# Patient Record
Sex: Male | Born: 1980 | Race: Black or African American | Hispanic: No | Marital: Single | Smoking: Never smoker
Health system: Southern US, Community
[De-identification: ages and names within clinical notes are randomized; demographics above are authoritative.]

## PROBLEM LIST (undated history)

## (undated) DIAGNOSIS — K529 Noninfective gastroenteritis and colitis, unspecified: Secondary | ICD-10-CM

## (undated) HISTORY — PX: OTHER SURGICAL HISTORY: SHX169

---

## 2011-10-18 ENCOUNTER — Encounter (HOSPITAL_COMMUNITY): Payer: Self-pay | Admitting: *Deleted

## 2011-10-18 ENCOUNTER — Emergency Department (HOSPITAL_COMMUNITY)
Admission: EM | Admit: 2011-10-18 | Discharge: 2011-10-18 | Disposition: A | Discharge status: Home / Self Care | Payer: Self-pay | Attending: Emergency Medicine | Admitting: Emergency Medicine

## 2011-10-18 DIAGNOSIS — L03019 Cellulitis of unspecified finger: Secondary | ICD-10-CM | POA: Insufficient documentation

## 2011-10-18 DIAGNOSIS — E119 Type 2 diabetes mellitus without complications: Secondary | ICD-10-CM | POA: Insufficient documentation

## 2011-10-18 DIAGNOSIS — IMO0001 Reserved for inherently not codable concepts without codable children: Secondary | ICD-10-CM

## 2011-10-18 DIAGNOSIS — Z794 Long term (current) use of insulin: Secondary | ICD-10-CM | POA: Insufficient documentation

## 2011-10-18 HISTORY — DX: Noninfective gastroenteritis and colitis, unspecified: K52.9

## 2011-10-18 NOTE — ED Notes (Signed)
Pt alert & oriented x4, stable gait. Pt given discharge instructions, paperwork. Patient instructed to stop at the registration desk to finish any additional paperwork. pt verbalized understanding. Pt left department w/ no further questions.  

## 2011-10-18 NOTE — Discharge Instructions (Signed)
Paronychia Paronychia is an inflammatory reaction involving the folds of the skin surrounding the fingernail. This is commonly caused by an infection in the skin around a nail. The most common cause of paronychia is frequent wetting of the hands (as seen with bartenders, food servers, nurses or others who wet their hands). This makes the skin around the fingernail susceptible to infection by bacteria (germs) or fungus. Other predisposing factors are:  Aggressive manicuring.   Nail biting.   Thumb sucking.  The most common cause is a staphylococcal (a type of germ) infection, or a fungal (Candida) infection. When caused by a germ, it usually comes on suddenly with redness, swelling, pus and is often painful. It may get under the nail and form an abscess (collection of pus), or form an abscess around the nail. If the nail itself is infected with a fungus, the treatment is usually prolonged and may require oral medicine for up to one year. Your caregiver will determine the length of time treatment is required. The paronychia caused by bacteria (germs) may largely be avoided by not pulling on hangnails or picking at cuticles. When the infection occurs at the tips of the finger it is called felon. When the cause of paronychia is from the herpes simplex virus (HSV) it is called herpetic whitlow. TREATMENT  When an abscess is present treatment is often incision and drainage. This means that the abscess must be cut open so the pus can get out. When this is done, the following home care instructions should be followed. HOME CARE INSTRUCTIONS   It is important to keep the affected fingers very dry. Rubber or plastic gloves over cotton gloves should be used whenever the hand must be placed in water.   Keep wound clean, dry and dressed as suggested by your caregiver between warm soaks or warm compresses.   Soak in warm water for fifteen to twenty minutes three to four times per day for bacterial infections.  Fungal infections are very difficult to treat, so often require treatment for long periods of time.   For bacterial (germ) infections take antibiotics (medicine which kill germs) as directed and finish the prescription, even if the problem appears to be solved before the medicine is gone.   Only take over-the-counter or prescription medicines for pain, discomfort, or fever as directed by your caregiver.  SEEK IMMEDIATE MEDICAL CARE IF:  You have redness, swelling, or increasing pain in the wound.   You notice pus coming from the wound.   You have a fever.   You notice a bad smell coming from the wound or dressing.  Document Released: 10/18/2000 Document Revised: 04/13/2011 Document Reviewed: 06/19/2008 [facility] Patient Information 2012 [place], [place].Heat Therapy Your caregiver advises heat therapy for your condition. Heat applications help reduce pain and muscle spasm around injuries or areas of inflammation. They also increase blood flow to the area which can speed healing. Moist heat is commonly used to help heal skin infections. Heat treatments should be used for about 30-40 minutes every 2-4 hours. Shorter treatments should be used if there is discomfort. Different forms of heat therapy are:  Warm water - Use a basin or tub filled with heated water; change it often to keep the water hot. The water temperature should not be uncomfortable to the skin.   Hot packs - Use several bath towels soaked in hot water and lightly wrung out. These should be changed every 5-10 minutes. You can buy commercially-available packs that provide more sustained heat.  Hot water bottles are not recommended because they give only a small amount of heat.   Electric heating pads - These may be used for dry heat only. Do not use wet material around a regular heating pad because of the risk of electrical shock. Do not leave heating pads on for long periods as they can burn the skin or cause permanent discoloration.  Do not lie on top of a heating pad because, again, this can cause a burn.   Heat lamps - Use an infrared light. Keep the bulb 15-25 inches from the skin. Watch for signs of excessive heat (blotchy areas will appear).  Be cautious with heat therapy to avoid burning the skin. You should not use heat therapy without careful medical supervision if you have: circulation problems, numbness or unusual swelling in the area to be treated. Document Released: 04/24/2005 Document Revised: 04/13/2011 Document Reviewed: 10/20/2006 [facility] Patient Information 2012 [place], [place].   Soak in warm water 5-10 minutes several times daily.  Return if any problems.

## 2011-10-18 NOTE — ED Provider Notes (Signed)
History     CSN: [id number]  Arrival date & time 10/18/11  1756   First MD Initiated Contact with Patient 10/18/11 1832      Chief Complaint  Patient presents with  . Hand Pain    (Consider location/radiation/quality/duration/timing/severity/associated sxs/prior treatment) HPI Comments: Swelling and pain to R second finger cuticle.  Patient is a 31 y.o. male presenting with hand pain. The history is provided by the patient. No language interpreter was used.  Hand Pain Episode onset: 2 days ago. The problem occurs constantly. The problem has been unchanged. Pertinent negatives include no fever. Exacerbated by: palpation. Treatments tried: poked it with a needle. The treatment provided no relief.    Past Medical History  Diagnosis Date  . Diabetes mellitus   . Colitis     Past Surgical History  Procedure Date  . Testicular tortion     No family history on file.  History  Substance Use Topics  . Smoking status: Never Smoker   . Smokeless tobacco: Not on file  . Alcohol Use: No      Review of Systems  Constitutional: Negative for fever.  Skin:       Paronychia   All other systems reviewed and are negative.    Allergies  Codeine and Latex  Home Medications   Current Outpatient Rx  Name Route Sig Dispense Refill  . ACETAMINOPHEN 500 MG PO TABS Oral Take 1,000 mg by mouth as needed. FOR PAIN    . INSULIN ASPART 100 UNIT/ML [place] SOLN Subcutaneous Inject 10-15 Units into the skin daily as needed. FOR HIGH BLOOD SUGAR LEVELS    . NAPROXEN SODIUM 220 MG PO CAPS Oral Take 220-440 mg by mouth as needed.      BP 127/88  Pulse 68  Temp 98 F (36.7 C) (Oral)  Resp 16  Ht 5\' 10"  (1.778 m)  Wt 185 lb (83.915 kg)  BMI 26.54 kg/m2  SpO2 100%  Physical Exam  Nursing note and vitals reviewed. Constitutional: He is oriented to person, place, and time. He appears well-developed and well-nourished.  HENT:  Head: Normocephalic and atraumatic.  Eyes: EOM are normal.   Neck: Normal range of motion.  Cardiovascular: Normal rate, regular rhythm, normal heart sounds and intact distal pulses.   Pulmonary/Chest: Effort normal and breath sounds normal. No respiratory distress.  Abdominal: Soft. He exhibits no distension. There is no tenderness.  Musculoskeletal: He exhibits tenderness.       Right hand: He exhibits decreased range of motion, tenderness and swelling.       Hands:      Cleaned cuticle area with betadine.  Using 18 ga. Needle the cuticle was undermined with good drainage of purulent material.  Pt tolerated well.  Neurological: He is alert and oriented to person, place, and time.  Skin: Skin is warm and dry.  Psychiatric: He has a normal mood and affect. Judgment normal.    ED Course  Procedures (including critical care time)  Labs Reviewed - No data to display No results found.   1. Paronychia of second finger of right hand       MDM  Simple paronychia.  Warm water soaks several times daily.  Return prn.        Worthy Rancher, PA 10/18/11 [phone]

## 2011-10-18 NOTE — ED Notes (Signed)
Pain and swelling to right index finger x 5 days.  Denies injury.

## 2011-10-19 NOTE — ED Provider Notes (Signed)
Medical screening examination/treatment/procedure(s) were performed by non-physician practitioner and as supervising physician I was immediately available for consultation/collaboration.   Laray Anger, DO 10/19/11 1806

## 2011-10-27 ENCOUNTER — Encounter (HOSPITAL_COMMUNITY): Payer: Self-pay | Admitting: *Deleted

## 2011-10-27 ENCOUNTER — Emergency Department (HOSPITAL_COMMUNITY)
Admission: EM | Admit: 2011-10-27 | Discharge: 2011-10-27 | Disposition: A | Discharge status: Home / Self Care | Payer: Self-pay | Attending: Emergency Medicine | Admitting: Emergency Medicine

## 2011-10-27 DIAGNOSIS — E119 Type 2 diabetes mellitus without complications: Secondary | ICD-10-CM | POA: Insufficient documentation

## 2011-10-27 DIAGNOSIS — L0231 Cutaneous abscess of buttock: Secondary | ICD-10-CM | POA: Insufficient documentation

## 2011-10-27 DIAGNOSIS — Z794 Long term (current) use of insulin: Secondary | ICD-10-CM | POA: Insufficient documentation

## 2011-10-27 LAB — GLUCOSE, CAPILLARY: Glucose-Capillary: 259 mg/dL — ABNORMAL HIGH (ref 70–99)

## 2011-10-27 MED ORDER — DOXYCYCLINE HYCLATE 100 MG PO TABS
ORAL_TABLET | ORAL | Status: AC
  Filled 2011-10-27: qty 1

## 2011-10-27 MED ORDER — DOXYCYCLINE HYCLATE 100 MG PO TABS
100.0000 mg | ORAL_TABLET | Freq: Once | ORAL | Status: AC
  Administered 2011-10-27: 100 mg via ORAL

## 2011-10-27 MED ORDER — DOXYCYCLINE HYCLATE 100 MG PO CAPS: 100.0000 mg | ORAL_CAPSULE | Freq: Two times a day (BID) | ORAL | Status: AC

## 2011-10-27 MED ORDER — OXYCODONE-ACETAMINOPHEN 5-325 MG PO TABS: ORAL_TABLET | ORAL | Status: DC

## 2011-10-27 NOTE — ED Provider Notes (Signed)
History     CSN: [id number]  Arrival date & time 10/27/11  1158   First MD Initiated Contact with Patient 10/27/11 1326      Chief Complaint  Patient presents with  . Abscess    (Consider location/radiation/quality/duration/timing/severity/associated sxs/prior treatment) HPI Comments: No h/o MRSA  Patient is a 31 y.o. male presenting with abscess. The history is provided by the patient. No language interpreter was used.  Abscess  This is a new problem. Episode onset: 2 days ago. The problem has been gradually worsening. Affected Location: left buttock. The problem is severe. The abscess is characterized by painfulness (painful.  minimal redness and swelling.). Pertinent negatives include no fever. His past medical history does not include atopy in family or skin abscesses in family. There were no sick contacts.    Past Medical History  Diagnosis Date  . Diabetes mellitus   . Colitis     Past Surgical History  Procedure Date  . Testicular tortion     History reviewed. No pertinent family history.  History  Substance Use Topics  . Smoking status: Never Smoker   . Smokeless tobacco: Not on file  . Alcohol Use: No      Review of Systems  Constitutional: Negative for fever and chills.  Skin:       abscess  All other systems reviewed and are negative.    Allergies  Codeine; Other; and Latex  Home Medications   Current Outpatient Rx  Name Route Sig Dispense Refill  . ACETAMINOPHEN 500 MG PO TABS Oral Take 1,000 mg by mouth as needed. FOR PAIN    . IBUPROFEN 200 MG PO TABS Oral Take 400 mg by mouth every 6 (six) hours as needed. For pain    . INSULIN ASPART 100 UNIT/ML [place] SOLN Subcutaneous Inject 10-15 Units into the skin daily as needed. FOR HIGH BLOOD SUGAR LEVELS (greater than 200)    . NAPROXEN SODIUM 220 MG PO CAPS Oral Take 220-440 mg by mouth as needed.    [place] DOXYCYCLINE HYCLATE 100 MG PO CAPS Oral Take 1 capsule (100 mg total) by mouth 2 (two) times  daily. 20 capsule 0  . OXYCODONE-ACETAMINOPHEN 5-325 MG PO TABS  One tab po q 4-6 hrs prn pain 6 tablet 0    BP 131/88  Pulse 101  Temp 98.2 F (36.8 C) (Oral)  Resp 20  Ht 5\' 10"  (1.778 m)  Wt 180 lb (81.647 kg)  BMI 25.83 kg/m2  SpO2 99%  Physical Exam  Nursing note and vitals reviewed. Constitutional: He is oriented to person, place, and time. He appears well-developed and well-nourished.  HENT:  Head: Normocephalic and atraumatic.  Eyes: EOM are normal.  Neck: Normal range of motion.  Cardiovascular: Normal rate, regular rhythm, normal heart sounds and intact distal pulses.   Pulmonary/Chest: Effort normal and breath sounds normal. No respiratory distress.  Abdominal: Soft. He exhibits no distension. There is no tenderness.  Musculoskeletal: Normal range of motion.       Pt has an early abscess formation to inner aspect of L buttock.  Minimal redness and swelling.  + induration but no fluctuance.  + PT  Neurological: He is alert and oriented to person, place, and time.  Skin: Skin is warm and dry.  Psychiatric: He has a normal mood and affect. Judgment normal.    ED Course  Procedures (including critical care time)  Labs Reviewed  GLUCOSE, CAPILLARY - Abnormal; Notable for the following:    Glucose-Capillary 259 (*)  All other components within normal limits   No results found.   1. Abscess of left buttock       MDM  rx doxycycline 100 mg, 20 rx percocet, 20 Ibuprofen Warm compresses.  Return if worsening in 2 days.          Worthy Rancher, PA 10/27/11 1348

## 2011-10-27 NOTE — ED Provider Notes (Signed)
Medical screening examination/treatment/procedure(s) were performed by non-physician practitioner and as supervising physician I was immediately available for consultation/collaboration.   Shelda Jakes, MD 10/27/11 2116

## 2011-10-27 NOTE — ED Notes (Signed)
Abscess to buttock area for 3 days

## 2011-10-27 NOTE — Discharge Instructions (Signed)
Abscess An abscess (boil or furuncle) is an infected area that contains a collection of pus.  SYMPTOMS Signs and symptoms of an abscess include pain, tenderness, redness, or hardness. You may feel a moveable soft area under your skin. An abscess can occur anywhere in the body.  TREATMENT  A surgical cut (incision) may be made over your abscess to drain the pus. Gauze may be packed into the space or a drain may be looped through the abscess cavity (pocket). This provides a drain that will allow the cavity to heal from the inside outwards. The abscess may be painful for a few days, but should feel much better if it was drained.  Your abscess, if seen early, may not have localized and may not have been drained. If not, another appointment may be required if it does not get better on its own or with medications. HOME CARE INSTRUCTIONS   Only take over-the-counter or prescription medicines for pain, discomfort, or fever as directed by your caregiver.   Take your antibiotics as directed if they were prescribed. Finish them even if you start to feel better.   Keep the skin and clothes clean around your abscess.   If the abscess was drained, you will need to use gauze dressing to collect any draining pus. Dressings will typically need to be changed 3 or more times a day.   The infection may spread by skin contact with others. Avoid skin contact as much as possible.   Practice good hygiene. This includes regular hand washing, cover any draining skin lesions, and do not share personal care items.   If you participate in sports, do not share athletic equipment, towels, whirlpools, or personal care items. Shower after every practice or tournament.   If a draining area cannot be adequately covered:   Do not participate in sports.   Children should not participate in day care until the wound has healed or drainage stops.   If your caregiver has given you a follow-up appointment, it is very important  to keep that appointment. Not keeping the appointment could result in a much worse infection, chronic or permanent injury, pain, and disability. If there is any problem keeping the appointment, you must call back to this facility for assistance.  SEEK MEDICAL CARE IF:   You develop increased pain, swelling, redness, drainage, or bleeding in the wound site.   You develop signs of generalized infection including muscle aches, chills, fever, or a general ill feeling.   You have an oral temperature above 102 F (38.9 C).  MAKE SURE YOU:   Understand these instructions.   Will watch your condition.   Will get help right away if you are not doing well or get worse.  Document Released: 02/01/2005 Document Revised: 04/13/2011 Document Reviewed: 11/26/2007 [facility] Patient Information 2012 [place], [place].  Take the meds as directed.  Take ibuprofen 800 mg every 8 hrs with food.  Soak in warm water or apply warm compresses several times daily.  Return if not improving in the next 2 days or sooner if worsening.

## 2011-10-30 ENCOUNTER — Encounter (HOSPITAL_COMMUNITY): Payer: Self-pay | Admitting: *Deleted

## 2011-10-30 ENCOUNTER — Emergency Department (HOSPITAL_COMMUNITY)
Admission: EM | Admit: 2011-10-30 | Discharge: 2011-10-30 | Disposition: A | Discharge status: Home / Self Care | Payer: Self-pay | Attending: Emergency Medicine | Admitting: Emergency Medicine

## 2011-10-30 DIAGNOSIS — L03317 Cellulitis of buttock: Secondary | ICD-10-CM | POA: Insufficient documentation

## 2011-10-30 DIAGNOSIS — Z794 Long term (current) use of insulin: Secondary | ICD-10-CM | POA: Insufficient documentation

## 2011-10-30 DIAGNOSIS — M25539 Pain in unspecified wrist: Secondary | ICD-10-CM | POA: Insufficient documentation

## 2011-10-30 DIAGNOSIS — E119 Type 2 diabetes mellitus without complications: Secondary | ICD-10-CM | POA: Insufficient documentation

## 2011-10-30 DIAGNOSIS — L0231 Cutaneous abscess of buttock: Secondary | ICD-10-CM | POA: Insufficient documentation

## 2011-10-30 MED ORDER — LIDOCAINE HCL (PF) 1 % IJ SOLN
INTRAMUSCULAR | Status: AC
  Administered 2011-10-30: 5 mL
  Filled 2011-10-30: qty 5

## 2011-10-30 MED ORDER — IBUPROFEN 800 MG PO TABS: 800.0000 mg | ORAL_TABLET | Freq: Three times a day (TID) | ORAL | Status: AC

## 2011-10-30 NOTE — ED Provider Notes (Signed)
History     CSN: [id number]  Arrival date & time 10/30/11  1119   None     Chief Complaint  Patient presents with  . Abscess    (Consider location/radiation/quality/duration/timing/severity/associated sxs/prior treatment) HPI Comments: Seen several days ago for abscess on L buttocks which at that time was indurated and sore but with no fluctuance.  Told to return if lesion became fluctuant.  It has begun draining today.  Taking doxycycline presently and hydrocodone.  The history is provided by the patient. No language interpreter was used.    Past Medical History  Diagnosis Date  . Diabetes mellitus   . Colitis     Past Surgical History  Procedure Date  . Testicular tortion     History reviewed. No pertinent family history.  History  Substance Use Topics  . Smoking status: Never Smoker   . Smokeless tobacco: Not on file  . Alcohol Use: No      Review of Systems  Constitutional: Negative for fever and chills.  Skin: Positive for wound.       abscess  All other systems reviewed and are negative.    Allergies  Codeine; Other; and Latex  Home Medications   Current Outpatient Rx  Name Route Sig Dispense Refill  . DOXYCYCLINE HYCLATE 100 MG PO CAPS Oral Take 1 capsule (100 mg total) by mouth 2 (two) times daily. 20 capsule 0  . INSULIN ASPART 100 UNIT/ML [place] SOLN Subcutaneous Inject 10-15 Units into the skin daily as needed. FOR HIGH BLOOD SUGAR LEVELS (greater than 200)    . NAPROXEN SODIUM 220 MG PO CAPS Oral Take 220-440 mg by mouth as needed.    . OXYCODONE-ACETAMINOPHEN 5-325 MG PO TABS Oral Take 1 tablet by mouth every 6 (six) hours as needed. For pain    . ACETAMINOPHEN 500 MG PO TABS Oral Take 1,000 mg by mouth as needed. FOR PAIN    . IBUPROFEN 200 MG PO TABS Oral Take 400 mg by mouth every 6 (six) hours as needed. For pain    . IBUPROFEN 800 MG PO TABS Oral Take 1 tablet (800 mg total) by mouth 3 (three) times daily. 21 tablet 0    BP 137/92   Pulse 97  Temp 98.3 F (36.8 C) (Oral)  Resp 20  Ht 5\' 10"  (1.778 m)  Wt 185 lb (83.915 kg)  BMI 26.54 kg/m2  SpO2 100%  Physical Exam  Nursing note and vitals reviewed. Constitutional: He is oriented to person, place, and time. He appears well-developed and well-nourished.  HENT:  Head: Normocephalic and atraumatic.  Eyes: EOM are normal.  Neck: Normal range of motion.  Cardiovascular: Normal rate, regular rhythm, normal heart sounds and intact distal pulses.   Pulmonary/Chest: Effort normal and breath sounds normal. No respiratory distress.  Abdominal: Soft. He exhibits no distension. There is no tenderness.  Musculoskeletal: Normal range of motion.       Fluctuant and draining abscess to L medial buttock.   Neurological: He is alert and oriented to person, place, and time.  Skin: Skin is warm and dry.  Psychiatric: He has a normal mood and affect. Judgment normal.    ED Course  INCISION AND DRAINAGE Date/Time: 10/31/2011 1:00 PM Performed by: Evalina Field Authorized by: Evalina Field Consent: Verbal consent obtained. Written consent not obtained. Risks and benefits: risks, benefits and alternatives were discussed Consent given by: patient Patient understanding: patient states understanding of the procedure being performed Patient consent: the patient's understanding of  the procedure matches consent given Site marked: the operative site was not marked Imaging studies: imaging studies not available Patient identity confirmed: verbally with patient Time out: Immediately prior to procedure a "time out" was called to verify the correct patient, procedure, equipment, support staff and site/side marked as required. Type: abscess Body area: anogenital (L medial buttock) Anesthesia: local infiltration Local anesthetic: lidocaine 2% with epinephrine Anesthetic total: 4 ml Patient sedated: no Scalpel size: 11 Needle gauge: 25 ga. Incision type: single  straight Complexity: simple Drainage: purulent Drainage amount: moderate Packing material: 1/4 in iodoform gauze (~ 8-9 inches of gauze inserted.) Patient tolerance: Patient tolerated the procedure well with no immediate complications.   (including critical care time)  Labs Reviewed - No data to display No results found.   1. Right wrist pain       MDM          Evalina Field, PA 10/30/11 1319  Evalina Field, [place] 11/04/11 0022

## 2011-10-30 NOTE — ED Notes (Signed)
Patient with no complaints at this time. Respirations even and unlabored. Skin warm/dry. Discharge instructions reviewed with patient at this time. Patient given opportunity to voice concerns/ask questions. IV removed per policy and band-aid applied to site. Patient discharged at this time and left Emergency Department with steady gait.  

## 2011-10-30 NOTE — ED Notes (Signed)
Abscess  On buttocks,  Seen here for same. Taking antibiotics  Here for recheck.

## 2011-10-31 ENCOUNTER — Emergency Department (HOSPITAL_COMMUNITY)
Admission: EM | Admit: 2011-10-31 | Discharge: 2011-10-31 | Disposition: A | Discharge status: Home / Self Care | Payer: Self-pay | Attending: Emergency Medicine | Admitting: Emergency Medicine

## 2011-10-31 ENCOUNTER — Encounter (HOSPITAL_COMMUNITY): Payer: Self-pay | Admitting: Emergency Medicine

## 2011-10-31 DIAGNOSIS — K612 Anorectal abscess: Secondary | ICD-10-CM | POA: Insufficient documentation

## 2011-10-31 DIAGNOSIS — Z794 Long term (current) use of insulin: Secondary | ICD-10-CM | POA: Insufficient documentation

## 2011-10-31 DIAGNOSIS — K5289 Other specified noninfective gastroenteritis and colitis: Secondary | ICD-10-CM | POA: Insufficient documentation

## 2011-10-31 DIAGNOSIS — Z885 Allergy status to narcotic agent status: Secondary | ICD-10-CM | POA: Insufficient documentation

## 2011-10-31 DIAGNOSIS — Z91040 Latex allergy status: Secondary | ICD-10-CM | POA: Insufficient documentation

## 2011-10-31 DIAGNOSIS — L0291 Cutaneous abscess, unspecified: Secondary | ICD-10-CM

## 2011-10-31 DIAGNOSIS — E119 Type 2 diabetes mellitus without complications: Secondary | ICD-10-CM | POA: Insufficient documentation

## 2011-10-31 NOTE — ED Provider Notes (Signed)
History     CSN: [id number]  Arrival date & time 10/31/11  5621   First MD Initiated Contact with Patient 10/31/11 0104      Chief Complaint  Patient presents with  . Abscess    (Consider location/radiation/quality/duration/timing/severity/associated sxs/prior treatment) HPI  Tacuma Graffam is a 31 y.o. male who had a rectal abscess I&D'd earlier today  presents to the Emergency Department complaining of continued pain at the abscess site plus inadvertent removal of some of the packing during defecation.  Past Medical History  Diagnosis Date  . Diabetes mellitus   . Colitis     Past Surgical History  Procedure Date  . Testicular tortion     History reviewed. No pertinent family history.  History  Substance Use Topics  . Smoking status: Never Smoker   . Smokeless tobacco: Not on file  . Alcohol Use: No      Review of Systems  Constitutional: Negative for fever.       10 Systems reviewed and are negative for acute change except as noted in the HPI.  HENT: Negative for congestion.   Eyes: Negative for discharge and redness.  Respiratory: Negative for cough and shortness of breath.   Cardiovascular: Negative for chest pain.  Gastrointestinal: Negative for vomiting and abdominal pain.  Genitourinary:       Pain to the rectum and site of abscess  Musculoskeletal: Negative for back pain.  Skin: Negative for rash.  Neurological: Negative for syncope, numbness and headaches.  Psychiatric/Behavioral:       No behavior change.    Allergies  Codeine; Other; and Latex  Home Medications   Current Outpatient Rx  Name Route Sig Dispense Refill  . ACETAMINOPHEN 500 MG PO TABS Oral Take 1,000 mg by mouth as needed. FOR PAIN    . DOXYCYCLINE HYCLATE 100 MG PO CAPS Oral Take 1 capsule (100 mg total) by mouth 2 (two) times daily. 20 capsule 0  . IBUPROFEN 200 MG PO TABS Oral Take 400 mg by mouth every 6 (six) hours as needed. For pain    . IBUPROFEN 800 MG PO TABS Oral  Take 1 tablet (800 mg total) by mouth 3 (three) times daily. 21 tablet 0  . INSULIN ASPART 100 UNIT/ML [place] SOLN Subcutaneous Inject 10-15 Units into the skin daily as needed. FOR HIGH BLOOD SUGAR LEVELS (greater than 200)    . NAPROXEN SODIUM 220 MG PO CAPS Oral Take 220-440 mg by mouth as needed.    . OXYCODONE-ACETAMINOPHEN 5-325 MG PO TABS Oral Take 1 tablet by mouth every 6 (six) hours as needed. For pain      BP 123/80  Pulse 80  Temp 98.3 F (36.8 C) (Oral)  Resp 14  Ht 5\' 10"  (1.778 m)  Wt 185 lb (83.915 kg)  BMI 26.54 kg/m2  SpO2 99%  Physical Exam  Nursing note and vitals reviewed. Constitutional:       Awake, alert, nontoxic appearance.  HENT:  Head: Atraumatic.  Eyes: Right eye exhibits no discharge. Left eye exhibits no discharge.  Neck: Neck supple.  Cardiovascular: Normal rate.   Pulmonary/Chest: Effort normal. He exhibits no tenderness.  Abdominal: Soft. There is no tenderness. There is no rebound.  Genitourinary:       4 inches of packing pulled out of abscess.  Musculoskeletal: He exhibits no tenderness.       Baseline ROM, no obvious new focal weakness.  Neurological:       Mental status and motor strength  appears baseline for patient and situation.  Skin: No rash noted.  Psychiatric: He has a normal mood and affect.    ED Course  Procedures (including critical care time)  Labs Reviewed - No data to display No results found.   1. Abscess    Trimmed packing.    MDM  Patient who had had a rectal abscess I&D earlier in the day. Packing had been pulled out partially. The trimmed packing.Pt stable in ED with no significant deterioration in condition.The patient appears reasonably screened and/or stabilized for discharge and I doubt any other medical condition or other [facility] requiring further screening, evaluation, or treatment in the ED at this time prior to discharge.  MDM Reviewed: nursing note and vitals           Nicoletta Dress. Colon Branch,  MD 10/31/11 [phone]

## 2011-10-31 NOTE — ED Notes (Signed)
Patient states he was here on June 24 and had and I & D with packing placed in an abscess on his buttock. States "I accidentally pulled the packing and most of it has come out and they told me not to remove it for 2 days. I need the packing put back in."

## 2011-10-31 NOTE — Discharge Instructions (Signed)
The packing may pull out and it is ok. Use a small pad in a pair of briefs to help protect your clothing. Keep your follow up appointment.

## 2011-11-04 NOTE — ED Provider Notes (Signed)
Medical screening examination/treatment/procedure(s) were performed by non-physician practitioner and as supervising physician I was immediately available for consultation/collaboration.  Shelda Jakes, MD 11/04/11 1020

## 2011-11-04 NOTE — Discharge Instructions (Signed)
Abscess Care After An abscess (also called a boil or furuncle) is an infected area that contains a collection of pus. Signs and symptoms of an abscess include pain, tenderness, redness, or hardness, or you may feel a moveable soft area under your skin. An abscess can occur anywhere in the body. The infection may spread to surrounding tissues causing cellulitis. A cut (incision) by the surgeon was made over your abscess and the pus was drained out. Gauze may have been packed into the space to provide a drain that will allow the cavity to heal from the inside outwards. The boil may be painful for 5 to 7 days. Most people with a boil do not have high fevers. Your abscess, if seen early, may not have localized, and may not have been lanced. If not, another appointment may be required for this if it does not get better on its own or with medications. HOME CARE INSTRUCTIONS   Only take over-the-counter or prescription medicines for pain, discomfort, or fever as directed by your caregiver.   When you bathe, soak and then remove gauze or iodoform packs at least daily or as directed by your caregiver. You may then wash the wound gently with mild soapy water. Repack with gauze or do as your caregiver directs.  SEEK IMMEDIATE MEDICAL CARE IF:   You develop increased pain, swelling, redness, drainage, or bleeding in the wound site.   You develop signs of generalized infection including muscle aches, chills, fever, or a general ill feeling.   An oral temperature above 102 F (38.9 C) develops, not controlled by medication.  See your caregiver for a recheck if you develop any of the symptoms described above. If medications (antibiotics) were prescribed, take them as directed. Document Released: 11/10/2004 Document Revised: 04/13/2011 Document Reviewed: 07/08/2007 [facility] Patient Information 2012 [place], [place].    Take the meds as directed.  Apply warm compresses several times daily.  Return as needed.

## 2013-06-04 ENCOUNTER — Encounter (HOSPITAL_COMMUNITY): Payer: Self-pay | Admitting: Emergency Medicine

## 2013-06-04 ENCOUNTER — Emergency Department (HOSPITAL_COMMUNITY)
Admission: EM | Admit: 2013-06-04 | Discharge: 2013-06-04 | Disposition: A | Discharge status: Home / Self Care | Payer: PRIVATE HEALTH INSURANCE | Attending: Emergency Medicine | Admitting: Emergency Medicine

## 2013-06-04 DIAGNOSIS — K055 Other periodontal diseases: Secondary | ICD-10-CM | POA: Insufficient documentation

## 2013-06-04 DIAGNOSIS — Z794 Long term (current) use of insulin: Secondary | ICD-10-CM | POA: Insufficient documentation

## 2013-06-04 DIAGNOSIS — Z91040 Latex allergy status: Secondary | ICD-10-CM | POA: Insufficient documentation

## 2013-06-04 DIAGNOSIS — K047 Periapical abscess without sinus: Secondary | ICD-10-CM | POA: Insufficient documentation

## 2013-06-04 DIAGNOSIS — E119 Type 2 diabetes mellitus without complications: Secondary | ICD-10-CM | POA: Insufficient documentation

## 2013-06-04 MED ORDER — TRAMADOL HCL 50 MG PO TABS: 50.0000 mg | ORAL_TABLET | Freq: Four times a day (QID) | ORAL | Status: DC | PRN

## 2013-06-04 MED ORDER — AMOXICILLIN 500 MG PO CAPS: 500.0000 mg | ORAL_CAPSULE | Freq: Three times a day (TID) | ORAL | Status: AC

## 2013-06-04 NOTE — Discharge Instructions (Signed)
Abscessed Tooth °An abscessed tooth is an infection around your tooth. It may be caused by holes or damage to the tooth (cavity) or a dental disease. An abscessed tooth causes mild to very bad pain in and around the tooth. See your dentist right away if you have tooth or gum pain. °HOME CARE °· Take your medicine as told. Finish it even if you start to feel better. °· Do not drive after taking pain medicine. °· Rinse your mouth (gargle) often with salt water (¼ teaspoon salt in 8 ounces of warm water). °· Do not apply heat to the outside of your face. °GET HELP RIGHT AWAY IF:  °· You have a temperature by mouth above 102° F (38.9° C), not controlled by medicine. °· You have chills and a very bad headache. °· You have problems breathing or swallowing. °· Your mouth will not open. °· You develop puffiness (swelling) on the neck or around the eye. °· Your pain is not helped by medicine. °· Your pain is getting worse instead of better. °MAKE SURE YOU:  °· Understand these instructions. °· Will watch your condition. °· Will get help right away if you are not doing well or get worse. °Document Released: 10/11/2007 Document Revised: 07/17/2011 Document Reviewed: 08/02/2010 °[employer]® Patient Information ©2014 [employer]. ° ° °Complete your entire course of antibiotics as prescribed.  You  may use the tramadol for pain relief but do not drive within 4 hours of taking as this will make you drowsy.  Avoid applying heat or ice to this abscess area which can worsen your symptoms.  You may use warm salt water swish and spit treatment or half peroxide and water swish and spit after meals to keep this area clean as discussed.  Call the dentist listed above for further management of your symptoms. ° ° ° °

## 2013-06-04 NOTE — ED Provider Notes (Signed)
CSN: [id number]     Arrival date & time 06/04/13  1646 History   First MD Initiated Contact with Patient 06/04/13 1927     Chief Complaint  Patient presents with  . Dental Pain   (Consider location/radiation/quality/duration/timing/severity/associated sxs/prior Treatment) HPI Comments: Tom Nichols is a 33 y.o. Male presenting with a 1 week history of dental pain and gingival swelling.  He has a history of fracture to the tooth involved for at least the past year which did not start causing pain until this week.  There has been no fevers,  Chills, nausea or vomiting, also no complaint of difficulty swallowing,  Although chewing makes pain worse.  The patient has tried tylenol and ibuprofen without relief of symptoms.         The history is provided by the patient.    Past Medical History  Diagnosis Date  . Diabetes mellitus   . Colitis    Past Surgical History  Procedure Laterality Date  . Testicular tortion     History reviewed. No pertinent family history. History  Substance Use Topics  . Smoking status: Never Smoker   . Smokeless tobacco: Not on file  . Alcohol Use: No    Review of Systems  Constitutional: Negative for fever.  HENT: Positive for dental problem. Negative for facial swelling and sore throat.   Respiratory: Negative for shortness of breath.   Musculoskeletal: Negative for neck pain and neck stiffness.    Allergies  Codeine; Other; and Latex  Home Medications   Current Outpatient Rx  Name  Route  Sig  Dispense  Refill  . acetaminophen (TYLENOL) 500 MG tablet   Oral   Take 1,000 mg by mouth every 8 (eight) hours as needed for mild pain.          Marland Kitchen. ibuprofen (ADVIL,MOTRIN) 800 MG tablet   Oral   Take 800 mg by mouth every 8 (eight) hours as needed for moderate pain.         Marland Kitchen. insulin aspart (NOVOLOG FLEXPEN) 100 UNIT/ML injection   Subcutaneous   Inject 10-15 Units into the skin 2 (two) times daily. FOR HIGH BLOOD SUGAR LEVELS (greater  than 200)         . amoxicillin (AMOXIL) 500 MG capsule   Oral   Take 1 capsule (500 mg total) by mouth 3 (three) times daily.   30 capsule   0   . traMADol (ULTRAM) 50 MG tablet   Oral   Take 1 tablet (50 mg total) by mouth every 6 (six) hours as needed.   20 tablet   0    BP 130/87  Pulse 70  Temp(Src) 98.2 F (36.8 C) (Oral)  Resp 18  SpO2 100% Physical Exam  Constitutional: He is oriented to person, place, and time. He appears well-developed and well-nourished. No distress.  HENT:  Head: Normocephalic and atraumatic.  Right Ear: Tympanic membrane and external ear normal.  Left Ear: Tympanic membrane and external ear normal.  Mouth/Throat: Oropharynx is clear and moist and mucous membranes are normal. No oral lesions. No trismus in the jaw. Dental abscesses present.    Dental fracture right upper third molar.  Slight gingival edema without fluctuance or erythema.  Eyes: Conjunctivae are normal.  Neck: Normal range of motion. Neck supple.  Cardiovascular: Normal rate and normal heart sounds.   Pulmonary/Chest: Effort normal.  Abdominal: He exhibits no distension.  Musculoskeletal: Normal range of motion.  Lymphadenopathy:    He has no cervical  adenopathy.  Neurological: He is alert and oriented to person, place, and time.  Skin: Skin is warm and dry. No erythema.  Psychiatric: He has a normal mood and affect.    ED Course  Procedures (including critical care time) Labs Review Labs Reviewed - No data to display Imaging Review No results found.  EKG Interpretation   None       MDM   1. Dental abscess    Probable early dental abscess.  Pt was prescribed tramadol, amoxil,  Dental referrals given.      Burgess Amor, PA-C 06/05/13 0104

## 2013-06-04 NOTE — ED Notes (Signed)
Pt c/o right side dental pain. 

## 2013-06-05 NOTE — ED Provider Notes (Signed)
Medical screening examination/treatment/procedure(s) were performed by non-physician practitioner and as supervising physician I was immediately available for consultation/collaboration.     Gerhard Munchobert Kemi Gell, MD 06/05/13 [phone]

## 2013-12-02 ENCOUNTER — Encounter: Payer: BC Managed Care – PPO | Attending: Internal Medicine | Admitting: Dietician

## 2013-12-02 ENCOUNTER — Encounter: Payer: Self-pay | Admitting: Dietician

## 2013-12-02 VITALS — Ht 70.0 in | Wt 180.3 lb

## 2013-12-02 DIAGNOSIS — Z713 Dietary counseling and surveillance: Secondary | ICD-10-CM | POA: Diagnosis present

## 2013-12-02 DIAGNOSIS — Z794 Long term (current) use of insulin: Secondary | ICD-10-CM | POA: Diagnosis not present

## 2013-12-02 DIAGNOSIS — E119 Type 2 diabetes mellitus without complications: Secondary | ICD-10-CM

## 2013-12-02 NOTE — Progress Notes (Signed)
Appt start time: 1715 end time:  1830.  Assessment:  Patient was seen on  12/02/2013 for individual diabetes education. Tom Nichols is here today to learn about diabetes. Has some concerns relating to myths about diabetes, medication, and skin care. Has not had an Hgb A1c lab pulled any time recently. PCP is managing diabetes. He had not had a doctor managing diabetes for "a long time". Was seeing an endocrinologist in [place]. Currently testing blood sugar 3 x week and averaging in the mid 200's. Has noticed when he ate lean protein and salad his number stayed low.  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 Does not skip meals M-F, but will skip 2 meals on weekends. Tries not to eat out much anymore (maybe 2 x month). Has been eating the same way for awhile and has been active for a while. Having fried foods about 3 x week.   Usual body weight is about 170 lbs. Was trying to gain some weight and is happy to be at 180 lbs.   Patient Education Plan per assessed needs and concerns is to attend individual session for Diabetes Self Management Education.   Current HbA1c: unknown  Preferred Learning Style:   No preference indicated   Learning Readiness:   Ready  MEDICATIONS: 20 unit Levemir at bed time  DIETARY INTAKE:  Avoided foods include: Kale, seafood, deer, lamb, celery   24-hr recall:  B ( AM): cereal with 2% or whole milk or yogurt Snk ( AM): none L ( PM): tuna with salad or chicken or ground [place] Snk ( PM): yogurt or fruit D ( PM): cold cuts-roast beef or salad, fried chicken, potatoes, pasta   Snk ( PM): fruit  Beverages: water or tea sometimes  Usual physical activity: 5 x week weights, dance aerobics 30-45 minutes of cardio  Estimated energy needs: 2200 calories 248 g carbohydrates 165 g protein 61 g fat  Progress Towards Goal(s):  In progress.   Nutritional Diagnosis:  Dowling-2.1 Inpaired nutrition utilization As related to glucose metabolism.  As  evidenced by blood sugar consistently over 200 mg/dl.    Intervention:  Nutrition counseling provided.  Discussed diabetes disease process and treatment options.  Discussed physiology of diabetes and role of obesity on insulin resistance.  Encouraged moderate weight reduction to improve glucose levels.  Discussed role of medications and diet in glucose control  Provided education on macronutrients on glucose levels.  Provided education on carb counting, importance of regularly scheduled meals/snacks, and meal planning  Discussed effects of physical activity on glucose levels and long-term glucose control.  Recommended 150 minutes of physical activity/week.  Reviewed patient medications.  Discussed role of medication on blood glucose and possible side effects  Discussed blood glucose monitoring and interpretation.  Discussed recommended target ranges and individual ranges.    Described short-term complications: hyper- and hypo-glycemia.  Discussed causes,symptoms, and treatment options.  Discussed prevention, detection, and treatment of long-term complications.  Discussed the role of prolonged elevated glucose levels on body systems.  Discussed role of stress on blood glucose levels and discussed strategies to manage psychosocial issues.  Discussed recommendations for long-term diabetes self-care.  Established checklist for medical, dental, and emotional self-care.  Teaching Method Utilized:  Visual Auditory Hands on  Handouts given during visit include:  Living Well with Diabetes  Yellow Card  15 g CHO Snacks  Carbohydrate counting  Blood Sugar Monitoring  Diabetes Medications and Insulin  Barriers to learning/adherence to lifestyle change: none  Diabetes self-care  support plan:   [facility] support group   Demonstrated degree of understanding via:  Teach Back   Monitoring/Evaluation:  Dietary intake, exercise, blood sugar monitoring, and body weight prn.

## 2013-12-02 NOTE — Patient Instructions (Signed)
Goals:  Follow Diabetes Meal Plan as instructed  Eat 3 meals and 2 snacks, every 3-5 hrs  Limit carbohydrate intake to 60-75 grams carbohydrate/meal  Limit carbohydrate intake to 15 grams carbohydrate/snack  Add lean protein foods to meals/snacks  Monitor glucose levels as instructed by your doctor  Aim for 30-60 mins of physical activity daily  Bring food record and glucose log to your next nutrition visit  If your glucose levels continue to be over 200, contact your doctor   Ask about getting an Hgb A1c test

## 2014-11-03 ENCOUNTER — Encounter (INDEPENDENT_AMBULATORY_CARE_PROVIDER_SITE_OTHER): Payer: BLUE CROSS/BLUE SHIELD | Admitting: Ophthalmology

## 2014-11-03 DIAGNOSIS — Z79899 Other long term (current) drug therapy: Secondary | ICD-10-CM

## 2014-11-03 DIAGNOSIS — H43813 Vitreous degeneration, bilateral: Secondary | ICD-10-CM

## 2014-11-03 DIAGNOSIS — H538 Other visual disturbances: Secondary | ICD-10-CM | POA: Diagnosis not present

## 2014-11-03 DIAGNOSIS — E10329 Type 1 diabetes mellitus with mild nonproliferative diabetic retinopathy without macular edema: Secondary | ICD-10-CM | POA: Diagnosis not present

## 2014-11-03 DIAGNOSIS — E10319 Type 1 diabetes mellitus with unspecified diabetic retinopathy without macular edema: Secondary | ICD-10-CM | POA: Diagnosis not present

## 2014-11-10 ENCOUNTER — Other Ambulatory Visit: Payer: Self-pay | Admitting: Internal Medicine

## 2014-11-10 DIAGNOSIS — E041 Nontoxic single thyroid nodule: Secondary | ICD-10-CM

## 2014-11-11 ENCOUNTER — Encounter (INDEPENDENT_AMBULATORY_CARE_PROVIDER_SITE_OTHER): Payer: Self-pay | Admitting: Ophthalmology

## 2014-11-12 ENCOUNTER — Ambulatory Visit
Admission: RE | Admit: 2014-11-12 | Discharge: 2014-11-12 | Disposition: A | Discharge status: Home / Self Care | Payer: BLUE CROSS/BLUE SHIELD | Source: Ambulatory Visit | Attending: Internal Medicine | Admitting: Internal Medicine

## 2014-11-12 DIAGNOSIS — E041 Nontoxic single thyroid nodule: Secondary | ICD-10-CM

## 2016-03-03 DIAGNOSIS — Z23 Encounter for immunization: Secondary | ICD-10-CM | POA: Diagnosis not present

## 2016-03-28 DIAGNOSIS — E1165 Type 2 diabetes mellitus with hyperglycemia: Secondary | ICD-10-CM | POA: Diagnosis not present

## 2016-03-28 DIAGNOSIS — A64 Unspecified sexually transmitted disease: Secondary | ICD-10-CM | POA: Diagnosis not present

## 2016-04-19 DIAGNOSIS — F4323 Adjustment disorder with mixed anxiety and depressed mood: Secondary | ICD-10-CM | POA: Diagnosis not present

## 2016-07-06 ENCOUNTER — Emergency Department (HOSPITAL_COMMUNITY): Payer: BLUE CROSS/BLUE SHIELD

## 2016-07-06 ENCOUNTER — Encounter (HOSPITAL_COMMUNITY): Payer: Self-pay

## 2016-07-06 ENCOUNTER — Emergency Department (HOSPITAL_COMMUNITY)
Admission: EM | Admit: 2016-07-06 | Discharge: 2016-07-06 | Disposition: A | Discharge status: Home / Self Care | Payer: BLUE CROSS/BLUE SHIELD | Attending: Emergency Medicine | Admitting: Emergency Medicine

## 2016-07-06 DIAGNOSIS — S3991XA Unspecified injury of abdomen, initial encounter: Secondary | ICD-10-CM | POA: Diagnosis not present

## 2016-07-06 DIAGNOSIS — S301XXA Contusion of abdominal wall, initial encounter: Secondary | ICD-10-CM | POA: Diagnosis not present

## 2016-07-06 DIAGNOSIS — Z91040 Latex allergy status: Secondary | ICD-10-CM | POA: Diagnosis not present

## 2016-07-06 DIAGNOSIS — Y999 Unspecified external cause status: Secondary | ICD-10-CM | POA: Diagnosis not present

## 2016-07-06 DIAGNOSIS — S299XXA Unspecified injury of thorax, initial encounter: Secondary | ICD-10-CM | POA: Diagnosis not present

## 2016-07-06 DIAGNOSIS — Y939 Activity, unspecified: Secondary | ICD-10-CM | POA: Insufficient documentation

## 2016-07-06 DIAGNOSIS — R079 Chest pain, unspecified: Secondary | ICD-10-CM | POA: Diagnosis not present

## 2016-07-06 DIAGNOSIS — E119 Type 2 diabetes mellitus without complications: Secondary | ICD-10-CM | POA: Insufficient documentation

## 2016-07-06 DIAGNOSIS — Y92410 Unspecified street and highway as the place of occurrence of the external cause: Secondary | ICD-10-CM | POA: Diagnosis not present

## 2016-07-06 DIAGNOSIS — R10819 Abdominal tenderness, unspecified site: Secondary | ICD-10-CM | POA: Diagnosis not present

## 2016-07-06 DIAGNOSIS — Z794 Long term (current) use of insulin: Secondary | ICD-10-CM | POA: Diagnosis not present

## 2016-07-06 DIAGNOSIS — S3690XA Unspecified injury of unspecified intra-abdominal organ, initial encounter: Secondary | ICD-10-CM | POA: Diagnosis not present

## 2016-07-06 LAB — CBC WITH DIFFERENTIAL/PLATELET
BASOS PCT: 1 %
Basophils Absolute: 0 10*3/uL (ref 0.0–0.1)
EOS PCT: 1 %
Eosinophils Absolute: 0 10*3/uL (ref 0.0–0.7)
HEMATOCRIT: 43 % (ref 39.0–52.0)
Hemoglobin: 14.3 g/dL (ref 13.0–17.0)
LYMPHS PCT: 42 %
Lymphs Abs: 1.7 10*3/uL (ref 0.7–4.0)
MCH: 27.6 pg (ref 26.0–34.0)
MCHC: 33.3 g/dL (ref 30.0–36.0)
MCV: 83 fL (ref 78.0–100.0)
MONO ABS: 0.4 10*3/uL (ref 0.1–1.0)
Monocytes Relative: 9 %
NEUTROS ABS: 1.9 10*3/uL (ref 1.7–7.7)
Neutrophils Relative %: 47 %
Platelets: 170 10*3/uL (ref 150–400)
RBC: 5.18 MIL/uL (ref 4.22–5.81)
RDW: 12.3 % (ref 11.5–15.5)
WBC: 4 10*3/uL (ref 4.0–10.5)

## 2016-07-06 LAB — I-STAT CHEM 8, ED
BUN: 9 mg/dL (ref 6–20)
CREATININE: 0.7 mg/dL (ref 0.61–1.24)
Calcium, Ion: 1.12 mmol/L — ABNORMAL LOW (ref 1.15–1.40)
Chloride: 102 mmol/L (ref 101–111)
GLUCOSE: 359 mg/dL — AB (ref 65–99)
HCT: 44 % (ref 39.0–52.0)
HEMOGLOBIN: 15 g/dL (ref 13.0–17.0)
Potassium: 4.3 mmol/L (ref 3.5–5.1)
Sodium: 136 mmol/L (ref 135–145)
TCO2: 27 mmol/L (ref 0–100)

## 2016-07-06 LAB — URINALYSIS, ROUTINE W REFLEX MICROSCOPIC
Bacteria, UA: NONE SEEN
Bilirubin Urine: NEGATIVE
Hgb urine dipstick: NEGATIVE
Ketones, ur: NEGATIVE mg/dL
Leukocytes, UA: NEGATIVE
Nitrite: NEGATIVE
PROTEIN: NEGATIVE mg/dL
SPECIFIC GRAVITY, URINE: 1.03 (ref 1.005–1.030)
SQUAMOUS EPITHELIAL / LPF: NONE SEEN
pH: 7 (ref 5.0–8.0)

## 2016-07-06 LAB — HEPATIC FUNCTION PANEL
ALK PHOS: 66 U/L (ref 38–126)
ALT: 23 U/L (ref 17–63)
AST: 23 U/L (ref 15–41)
Albumin: 3.6 g/dL (ref 3.5–5.0)
BILIRUBIN DIRECT: 0.2 mg/dL (ref 0.1–0.5)
Indirect Bilirubin: 0.5 mg/dL (ref 0.3–0.9)
Total Bilirubin: 0.7 mg/dL (ref 0.3–1.2)
Total Protein: 6.1 g/dL — ABNORMAL LOW (ref 6.5–8.1)

## 2016-07-06 LAB — CBG MONITORING, ED: Glucose-Capillary: 327 mg/dL — ABNORMAL HIGH (ref 65–99)

## 2016-07-06 MED ORDER — FENTANYL CITRATE (PF) 100 MCG/2ML IJ SOLN
100.0000 ug | Freq: Once | INTRAMUSCULAR | Status: AC
  Administered 2016-07-06: 100 ug via INTRAVENOUS
  Filled 2016-07-06: qty 2

## 2016-07-06 MED ORDER — SODIUM CHLORIDE 0.9 % IV BOLUS (SEPSIS)
500.0000 mL | Freq: Once | INTRAVENOUS | Status: AC
  Administered 2016-07-06: 500 mL via INTRAVENOUS

## 2016-07-06 MED ORDER — METHOCARBAMOL 500 MG PO TABS: 500.0000 mg | ORAL_TABLET | Freq: Three times a day (TID) | ORAL | 0 refills | Status: AC | PRN

## 2016-07-06 MED ORDER — IOPAMIDOL (ISOVUE-300) INJECTION 61%
INTRAVENOUS | Status: AC
  Filled 2016-07-06: qty 30

## 2016-07-06 MED ORDER — IOPAMIDOL (ISOVUE-300) INJECTION 61%
INTRAVENOUS | Status: AC
  Administered 2016-07-06: 100 mL
  Filled 2016-07-06: qty 100
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
## 2016-07-10 DIAGNOSIS — R0789 Other chest pain: Secondary | ICD-10-CM | POA: Diagnosis not present

## 2016-07-10 DIAGNOSIS — S301XXD Contusion of abdominal wall, subsequent encounter: Secondary | ICD-10-CM | POA: Diagnosis not present

## 2016-07-10 DIAGNOSIS — M542 Cervicalgia: Secondary | ICD-10-CM | POA: Diagnosis not present

## 2016-07-13 DIAGNOSIS — M545 Low back pain: Secondary | ICD-10-CM | POA: Diagnosis not present

## 2016-07-13 DIAGNOSIS — M542 Cervicalgia: Secondary | ICD-10-CM | POA: Diagnosis not present

## 2016-07-19 DIAGNOSIS — Z23 Encounter for immunization: Secondary | ICD-10-CM | POA: Diagnosis not present

## 2016-07-19 DIAGNOSIS — Z833 Family history of diabetes mellitus: Secondary | ICD-10-CM | POA: Diagnosis not present

## 2016-07-19 DIAGNOSIS — E1165 Type 2 diabetes mellitus with hyperglycemia: Secondary | ICD-10-CM | POA: Diagnosis not present

## 2016-07-19 DIAGNOSIS — E042 Nontoxic multinodular goiter: Secondary | ICD-10-CM | POA: Diagnosis not present

## 2016-07-24 DIAGNOSIS — G43A0 Cyclical vomiting, not intractable: Secondary | ICD-10-CM | POA: Diagnosis not present

## 2016-07-24 DIAGNOSIS — M545 Low back pain: Secondary | ICD-10-CM | POA: Diagnosis not present

## 2016-07-24 DIAGNOSIS — M542 Cervicalgia: Secondary | ICD-10-CM | POA: Diagnosis not present

## 2016-07-24 DIAGNOSIS — E1165 Type 2 diabetes mellitus with hyperglycemia: Secondary | ICD-10-CM | POA: Diagnosis not present

## 2016-07-24 DIAGNOSIS — R1084 Generalized abdominal pain: Secondary | ICD-10-CM | POA: Diagnosis not present

## 2016-07-25 DIAGNOSIS — M545 Low back pain: Secondary | ICD-10-CM | POA: Diagnosis not present

## 2016-07-25 DIAGNOSIS — R1084 Generalized abdominal pain: Secondary | ICD-10-CM | POA: Diagnosis not present

## 2016-08-15 DIAGNOSIS — R1084 Generalized abdominal pain: Secondary | ICD-10-CM | POA: Diagnosis not present

## 2016-08-15 DIAGNOSIS — M545 Low back pain: Secondary | ICD-10-CM | POA: Diagnosis not present

## 2016-08-24 DIAGNOSIS — M546 Pain in thoracic spine: Secondary | ICD-10-CM | POA: Diagnosis not present

## 2016-08-29 DIAGNOSIS — M546 Pain in thoracic spine: Secondary | ICD-10-CM | POA: Diagnosis not present

## 2016-08-29 DIAGNOSIS — S134XXS Sprain of ligaments of cervical spine, sequela: Secondary | ICD-10-CM | POA: Diagnosis not present

## 2016-08-30 DIAGNOSIS — M546 Pain in thoracic spine: Secondary | ICD-10-CM | POA: Diagnosis not present

## 2016-09-01 DIAGNOSIS — Z833 Family history of diabetes mellitus: Secondary | ICD-10-CM | POA: Diagnosis not present

## 2016-09-01 DIAGNOSIS — E1165 Type 2 diabetes mellitus with hyperglycemia: Secondary | ICD-10-CM | POA: Diagnosis not present

## 2016-09-01 DIAGNOSIS — E042 Nontoxic multinodular goiter: Secondary | ICD-10-CM | POA: Diagnosis not present

## 2016-09-01 DIAGNOSIS — E663 Overweight: Secondary | ICD-10-CM | POA: Diagnosis not present

## 2016-09-04 DIAGNOSIS — M546 Pain in thoracic spine: Secondary | ICD-10-CM | POA: Diagnosis not present

## 2016-09-11 DIAGNOSIS — M546 Pain in thoracic spine: Secondary | ICD-10-CM | POA: Diagnosis not present

## 2016-09-18 DIAGNOSIS — M546 Pain in thoracic spine: Secondary | ICD-10-CM | POA: Diagnosis not present

## 2016-10-06 DIAGNOSIS — S134XXS Sprain of ligaments of cervical spine, sequela: Secondary | ICD-10-CM | POA: Diagnosis not present

## 2016-10-06 DIAGNOSIS — M542 Cervicalgia: Secondary | ICD-10-CM | POA: Diagnosis not present

## 2016-10-06 DIAGNOSIS — M546 Pain in thoracic spine: Secondary | ICD-10-CM | POA: Diagnosis not present

## 2016-11-17 DIAGNOSIS — E663 Overweight: Secondary | ICD-10-CM | POA: Diagnosis not present

## 2016-11-17 DIAGNOSIS — E1165 Type 2 diabetes mellitus with hyperglycemia: Secondary | ICD-10-CM | POA: Diagnosis not present

## 2016-11-17 DIAGNOSIS — Z833 Family history of diabetes mellitus: Secondary | ICD-10-CM | POA: Diagnosis not present

## 2016-11-17 DIAGNOSIS — E042 Nontoxic multinodular goiter: Secondary | ICD-10-CM | POA: Diagnosis not present

## 2016-12-16 DIAGNOSIS — H5213 Myopia, bilateral: Secondary | ICD-10-CM | POA: Diagnosis not present

## 2016-12-22 DIAGNOSIS — M542 Cervicalgia: Secondary | ICD-10-CM | POA: Diagnosis not present

## 2016-12-22 DIAGNOSIS — M546 Pain in thoracic spine: Secondary | ICD-10-CM | POA: Diagnosis not present

## 2016-12-22 DIAGNOSIS — S134XXS Sprain of ligaments of cervical spine, sequela: Secondary | ICD-10-CM | POA: Diagnosis not present

## 2017-01-26 DIAGNOSIS — Z23 Encounter for immunization: Secondary | ICD-10-CM | POA: Diagnosis not present

## 2017-01-26 DIAGNOSIS — E042 Nontoxic multinodular goiter: Secondary | ICD-10-CM | POA: Diagnosis not present

## 2017-01-26 DIAGNOSIS — E1165 Type 2 diabetes mellitus with hyperglycemia: Secondary | ICD-10-CM | POA: Diagnosis not present

## 2017-01-26 DIAGNOSIS — E663 Overweight: Secondary | ICD-10-CM | POA: Diagnosis not present

## 2017-06-01 DIAGNOSIS — E1165 Type 2 diabetes mellitus with hyperglycemia: Secondary | ICD-10-CM | POA: Diagnosis not present

## 2017-06-01 DIAGNOSIS — E663 Overweight: Secondary | ICD-10-CM | POA: Diagnosis not present

## 2017-06-01 DIAGNOSIS — E042 Nontoxic multinodular goiter: Secondary | ICD-10-CM | POA: Diagnosis not present

## 2017-08-31 DIAGNOSIS — E663 Overweight: Secondary | ICD-10-CM | POA: Diagnosis not present

## 2017-08-31 DIAGNOSIS — E042 Nontoxic multinodular goiter: Secondary | ICD-10-CM | POA: Diagnosis not present

## 2017-08-31 DIAGNOSIS — E1165 Type 2 diabetes mellitus with hyperglycemia: Secondary | ICD-10-CM | POA: Diagnosis not present

## 2018-01-04 DIAGNOSIS — E663 Overweight: Secondary | ICD-10-CM | POA: Diagnosis not present

## 2018-01-04 DIAGNOSIS — E1165 Type 2 diabetes mellitus with hyperglycemia: Secondary | ICD-10-CM | POA: Diagnosis not present

## 2018-01-04 DIAGNOSIS — E042 Nontoxic multinodular goiter: Secondary | ICD-10-CM | POA: Diagnosis not present
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
## 2018-03-19 DIAGNOSIS — Z23 Encounter for immunization: Secondary | ICD-10-CM | POA: Diagnosis not present

## 2018-04-19 DIAGNOSIS — E042 Nontoxic multinodular goiter: Secondary | ICD-10-CM | POA: Diagnosis not present

## 2018-04-19 DIAGNOSIS — Z79899 Other long term (current) drug therapy: Secondary | ICD-10-CM | POA: Diagnosis not present

## 2018-04-19 DIAGNOSIS — E663 Overweight: Secondary | ICD-10-CM | POA: Diagnosis not present

## 2018-04-19 DIAGNOSIS — E1165 Type 2 diabetes mellitus with hyperglycemia: Secondary | ICD-10-CM | POA: Diagnosis not present

## 2018-04-19 DIAGNOSIS — R21 Rash and other nonspecific skin eruption: Secondary | ICD-10-CM | POA: Diagnosis not present

## 2018-04-26 DIAGNOSIS — E119 Type 2 diabetes mellitus without complications: Secondary | ICD-10-CM | POA: Diagnosis not present

## 2018-04-26 DIAGNOSIS — H40011 Open angle with borderline findings, low risk, right eye: Secondary | ICD-10-CM | POA: Diagnosis not present

## 2018-04-26 DIAGNOSIS — H25013 Cortical age-related cataract, bilateral: Secondary | ICD-10-CM | POA: Diagnosis not present

## 2018-04-26 DIAGNOSIS — H40001 Preglaucoma, unspecified, right eye: Secondary | ICD-10-CM | POA: Diagnosis not present

## 2018-09-09 DIAGNOSIS — E1169 Type 2 diabetes mellitus with other specified complication: Secondary | ICD-10-CM | POA: Diagnosis not present

## 2018-09-09 DIAGNOSIS — N529 Male erectile dysfunction, unspecified: Secondary | ICD-10-CM | POA: Diagnosis not present

## 2018-09-10 DIAGNOSIS — E1169 Type 2 diabetes mellitus with other specified complication: Secondary | ICD-10-CM | POA: Diagnosis not present

## 2018-09-10 DIAGNOSIS — E291 Testicular hypofunction: Secondary | ICD-10-CM | POA: Diagnosis not present

## 2018-09-10 DIAGNOSIS — E042 Nontoxic multinodular goiter: Secondary | ICD-10-CM | POA: Diagnosis not present

## 2018-09-12 DIAGNOSIS — E663 Overweight: Secondary | ICD-10-CM | POA: Diagnosis not present

## 2018-09-12 DIAGNOSIS — E1165 Type 2 diabetes mellitus with hyperglycemia: Secondary | ICD-10-CM | POA: Diagnosis not present

## 2018-09-12 DIAGNOSIS — E042 Nontoxic multinodular goiter: Secondary | ICD-10-CM | POA: Diagnosis not present

## 2018-09-17 DIAGNOSIS — Z79899 Other long term (current) drug therapy: Secondary | ICD-10-CM | POA: Diagnosis not present

## 2018-09-17 DIAGNOSIS — E291 Testicular hypofunction: Secondary | ICD-10-CM | POA: Diagnosis not present

## 2018-10-18 DIAGNOSIS — E291 Testicular hypofunction: Secondary | ICD-10-CM | POA: Diagnosis not present

## 2018-10-18 DIAGNOSIS — Z79899 Other long term (current) drug therapy: Secondary | ICD-10-CM | POA: Diagnosis not present

## 2018-10-30 DIAGNOSIS — Z20828 Contact with and (suspected) exposure to other viral communicable diseases: Secondary | ICD-10-CM | POA: Diagnosis not present

## 2018-12-20 DIAGNOSIS — E042 Nontoxic multinodular goiter: Secondary | ICD-10-CM | POA: Diagnosis not present

## 2018-12-20 DIAGNOSIS — E1165 Type 2 diabetes mellitus with hyperglycemia: Secondary | ICD-10-CM | POA: Diagnosis not present

## 2018-12-20 DIAGNOSIS — E663 Overweight: Secondary | ICD-10-CM | POA: Diagnosis not present

## 2019-03-04 DIAGNOSIS — J029 Acute pharyngitis, unspecified: Secondary | ICD-10-CM | POA: Diagnosis not present

## 2019-04-07 DIAGNOSIS — E042 Nontoxic multinodular goiter: Secondary | ICD-10-CM | POA: Diagnosis not present

## 2019-04-07 DIAGNOSIS — E663 Overweight: Secondary | ICD-10-CM | POA: Diagnosis not present

## 2019-04-07 DIAGNOSIS — E1165 Type 2 diabetes mellitus with hyperglycemia: Secondary | ICD-10-CM | POA: Diagnosis not present

## 2019-05-13 DIAGNOSIS — R05 Cough: Secondary | ICD-10-CM | POA: Diagnosis not present

## 2019-05-22 ENCOUNTER — Ambulatory Visit: Payer: BLUE CROSS/BLUE SHIELD | Attending: Internal Medicine

## 2019-05-22 ENCOUNTER — Other Ambulatory Visit: Payer: Self-pay

## 2019-05-22 DIAGNOSIS — Z20822 Contact with and (suspected) exposure to covid-19: Secondary | ICD-10-CM

## 2019-05-23 LAB — NOVEL CORONAVIRUS, NAA: SARS-CoV-2, NAA: NOT DETECTED

## 2019-08-11 DIAGNOSIS — E1165 Type 2 diabetes mellitus with hyperglycemia: Secondary | ICD-10-CM | POA: Diagnosis not present

## 2019-08-11 DIAGNOSIS — E042 Nontoxic multinodular goiter: Secondary | ICD-10-CM | POA: Diagnosis not present

## 2019-08-11 DIAGNOSIS — E663 Overweight: Secondary | ICD-10-CM | POA: Diagnosis not present

## 2019-12-22 ENCOUNTER — Other Ambulatory Visit: Payer: Self-pay | Admitting: Internal Medicine

## 2019-12-22 ENCOUNTER — Ambulatory Visit
Admission: RE | Admit: 2019-12-22 | Discharge: 2019-12-22 | Disposition: A | Discharge status: Home / Self Care | Payer: BLUE CROSS/BLUE SHIELD | Source: Ambulatory Visit | Attending: Internal Medicine | Admitting: Internal Medicine

## 2019-12-22 DIAGNOSIS — M419 Scoliosis, unspecified: Secondary | ICD-10-CM | POA: Diagnosis not present

## 2019-12-22 DIAGNOSIS — R109 Unspecified abdominal pain: Secondary | ICD-10-CM

## 2020-01-22 DIAGNOSIS — E042 Nontoxic multinodular goiter: Secondary | ICD-10-CM | POA: Diagnosis not present

## 2020-01-22 DIAGNOSIS — E663 Overweight: Secondary | ICD-10-CM | POA: Diagnosis not present

## 2020-01-22 DIAGNOSIS — E1165 Type 2 diabetes mellitus with hyperglycemia: Secondary | ICD-10-CM | POA: Diagnosis not present

## 2020-04-28 DIAGNOSIS — Z20822 Contact with and (suspected) exposure to covid-19: Secondary | ICD-10-CM | POA: Diagnosis not present

## 2020-11-22 DIAGNOSIS — E663 Overweight: Secondary | ICD-10-CM | POA: Diagnosis not present

## 2020-11-22 DIAGNOSIS — E042 Nontoxic multinodular goiter: Secondary | ICD-10-CM | POA: Diagnosis not present

## 2020-11-22 DIAGNOSIS — E1165 Type 2 diabetes mellitus with hyperglycemia: Secondary | ICD-10-CM | POA: Diagnosis not present
